# Patient Record
Sex: Male | Born: 1975 | Race: Black or African American | Hispanic: No | Marital: Married | Smoking: Former smoker
Health system: Southern US, Community
[De-identification: ages and names within clinical notes are randomized; demographics above are authoritative.]

---

## 2003-03-26 ENCOUNTER — Encounter: Payer: Self-pay | Admitting: Emergency Medicine

## 2003-03-26 ENCOUNTER — Emergency Department (HOSPITAL_COMMUNITY): Admission: EM | Admit: 2003-03-26 | Discharge: 2003-03-26 | Payer: Self-pay

## 2013-05-06 ENCOUNTER — Ambulatory Visit (INDEPENDENT_AMBULATORY_CARE_PROVIDER_SITE_OTHER): Payer: BC Managed Care – PPO | Admitting: Internal Medicine

## 2013-05-06 VITALS — BP 106/70 | HR 67 | Temp 98.4°F | Resp 18 | Wt 177.0 lb

## 2013-05-06 DIAGNOSIS — J22 Unspecified acute lower respiratory infection: Secondary | ICD-10-CM

## 2013-05-06 DIAGNOSIS — J45909 Unspecified asthma, uncomplicated: Secondary | ICD-10-CM

## 2013-05-06 DIAGNOSIS — R05 Cough: Secondary | ICD-10-CM

## 2013-05-06 DIAGNOSIS — J9801 Acute bronchospasm: Secondary | ICD-10-CM

## 2013-05-06 DIAGNOSIS — F17200 Nicotine dependence, unspecified, uncomplicated: Secondary | ICD-10-CM

## 2013-05-06 DIAGNOSIS — J988 Other specified respiratory disorders: Secondary | ICD-10-CM

## 2013-05-06 MED ORDER — ALBUTEROL SULFATE (2.5 MG/3ML) 0.083% IN NEBU: 2.5000 mg | INHALATION_SOLUTION | Freq: Four times a day (QID) | RESPIRATORY_TRACT | Status: DC | PRN

## 2013-05-06 MED ORDER — HYDROCODONE-HOMATROPINE 5-1.5 MG/5ML PO SYRP: 5.0000 mL | ORAL_SOLUTION | Freq: Four times a day (QID) | ORAL | Status: DC | PRN

## 2013-05-06 MED ORDER — AZITHROMYCIN 250 MG PO TABS: ORAL_TABLET | ORAL | Status: DC

## 2013-05-06 MED ORDER — ALBUTEROL SULFATE (2.5 MG/3ML) 0.083% IN NEBU
2.5000 mg | INHALATION_SOLUTION | Freq: Once | RESPIRATORY_TRACT | Status: AC
  Administered 2013-05-06: 2.5 mg via RESPIRATORY_TRACT

## 2013-05-06 MED ORDER — PREDNISONE 20 MG PO TABS: 20.0000 mg | ORAL_TABLET | Freq: Every day | ORAL | Status: DC

## 2013-05-06 NOTE — Progress Notes (Signed)
  Subjective:    Patient ID: Jeremiah Moore, male    DOB: 02-08-1976, 37 y.o.   MRN: [id number]  HPI Has 3 day history of nasal congestion, sore throat, wheezing. Coughing up yellow sputum. Chest pain only with coughing. Feels like breathing shallow to prevent coughing. Used child's albuterol treatment last night Helped him breath deeper without coughing.Tried some daytime cold medicine with some clearing of congestion.  Has 3 daughters. Everyone in house has been sick off and on.  Was diagnosed with asthmatic bronchitis as a child. Has had some episodes of bronchitis in past, none for a few years.    Smokes 1/3 ppd. Contemplating quitting smoking.   Review of Systems No fever, no chills. No nausea, no vomiting.  No headache,  no ear pain.  Appetite normal.  Drinks a lot of water  4-6 glasses a day.     Objective:   Physical Exam  Constitutional: He is oriented to person, place, and time. He appears well-developed and well-nourished. No distress.  HENT:  Right Ear: Tympanic membrane, external ear and ear canal normal.  Left Ear: Tympanic membrane, external ear and ear canal normal.  Nose: Mucosal edema and rhinorrhea present.  Mouth/Throat: Uvula is midline and mucous membranes are normal. Posterior oropharyngeal edema present. No oropharyngeal exudate or posterior oropharyngeal erythema.  Eyes: Pupils are equal, round, and reactive to light. Right eye exhibits no discharge. Left eye exhibits no discharge.  Neck: Normal range of motion.  Cardiovascular: Normal rate, regular rhythm and normal heart sounds.   Pulmonary/Chest: Effort normal. No respiratory distress.  Wheezes throughout ant/post  Musculoskeletal: He exhibits no edema.  Lymphadenopathy:    He has no cervical adenopathy.  Neurological: He is alert and oriented to person, place, and time.  Skin: Skin is warm and dry. He is not diaphoretic.  Psychiatric: He has a normal mood and affect. His behavior is normal.  Judgment and thought content normal.   Neb-(PROVENTIL) (2.5 MG/3ML) 0.083% nebulizer solution 2.5 mg==subjective and objectiv imprv    Assessment & Plan:  Cough - Plan: hycodan  Tobacco use disorder--disc quit plan  Acute bronchospasm--underlying RAD made worse w/ cigs  Lower respiratory infection (e.g., bronchitis, pneumonia, pneumonitis, pulmonitis)  Meds ordered this encounter  Medications  . albuterol (PROVENTIL) (2.5 MG/3ML) 0.083% nebulizer solution 2.5 mg    Sig:   . HYDROcodone-homatropine (HYCODAN) 5-1.5 MG/5ML syrup    Sig: Take 5 mLs by mouth every 6 (six) hours as needed for cough.    Dispense:  120 mL    Refill:  0  . azithromycin (ZITHROMAX) 250 MG tablet    Sig: As packaged    Dispense:  6 tablet    Refill:  0  . predniSONE (DELTASONE) 20 MG tablet    Sig: Take 1 tablet (20 mg total) by mouth daily. 4/3/3/2/2/1/1 single daily dose for 7 days    Dispense:  16 tablet    Refill:  0   Discussed smoking cessation and provided written information. Patient to return if worsening symptoms, fever/chills, chest pain or SOB.   DG/RPD

## 2013-05-06 NOTE — Patient Instructions (Signed)
Smoking Cessation Quitting smoking is important to your health and has many advantages. However, it is not always easy to quit since nicotine is a very addictive drug. Often times, people try 3 times or more before being able to quit. This document explains the best ways for you to prepare to quit smoking. Quitting takes hard work and a lot of effort, but you can do it. ADVANTAGES OF QUITTING SMOKING  You will live longer, feel better, and live better.  Your body will feel the impact of quitting smoking almost immediately.  Within 20 minutes, blood pressure decreases. Your pulse returns to its normal level.  After 8 hours, carbon monoxide levels in the blood return to normal. Your oxygen level increases.  After 24 hours, the chance of having a heart attack starts to decrease. Your breath, hair, and body stop smelling like smoke.  After 48 hours, damaged nerve endings begin to recover. Your sense of taste and smell improve.  After 72 hours, the body is virtually free of nicotine. Your bronchial tubes relax and breathing becomes easier.  After 2 to 12 weeks, lungs can hold more air. Exercise becomes easier and circulation improves.  The risk of having a heart attack, stroke, cancer, or lung disease is greatly reduced.  After 1 year, the risk of coronary heart disease is cut in half.  After 5 years, the risk of stroke falls to the same as a nonsmoker.  After 10 years, the risk of lung cancer is cut in half and the risk of other cancers decreases significantly.  After 15 years, the risk of coronary heart disease drops, usually to the level of a nonsmoker.  If you are pregnant, quitting smoking will improve your chances of having a healthy baby.  The people you live with, especially any children, will be healthier.  You will have extra money to spend on things other than cigarettes. QUESTIONS TO THINK ABOUT BEFORE ATTEMPTING TO QUIT You may want to talk about your answers with your  caregiver.  Why do you want to quit?  If you tried to quit in the past, what helped and what did not?  What will be the most difficult situations for you after you quit? How will you plan to handle them?  Who can help you through the tough times? Your family? Friends? A caregiver?  What pleasures do you get from smoking? What ways can you still get pleasure if you quit? Here are some questions to ask your caregiver:  How can you help me to be successful at quitting?  What medicine do you think would be best for me and how should I take it?  What should I do if I need more help?  What is smoking withdrawal like? How can I get information on withdrawal? GET READY  Set a quit date.  Change your environment by getting rid of all cigarettes, ashtrays, matches, and lighters in your home, car, or work. Do not let people smoke in your home.  Review your past attempts to quit. Think about what worked and what did not. GET SUPPORT AND ENCOURAGEMENT You have a better chance of being successful if you have help. You can get support in many ways.  Tell your family, friends, and co-workers that you are going to quit and need their support. Ask them not to smoke around you.  Get individual, group, or telephone counseling and support. Programs are available at local hospitals and health centers. Call your local health department for   information about programs in your area.  Spiritual beliefs and practices may help some smokers quit.  Download a "quit meter" on your computer to keep track of quit statistics, such as how long you have gone without smoking, cigarettes not smoked, and money saved.  Get a self-help book about quitting smoking and staying off of tobacco. LEARN NEW SKILLS AND BEHAVIORS  Distract yourself from urges to smoke. Talk to someone, go for a walk, or occupy your time with a task.  Change your normal routine. Take a different route to work. Drink tea instead of coffee.  Eat breakfast in a different place.  Reduce your stress. Take a hot bath, exercise, or read a book.  Plan something enjoyable to do every day. Reward yourself for not smoking.  Explore interactive web-based programs that specialize in helping you quit. GET MEDICINE AND USE IT CORRECTLY Medicines can help you stop smoking and decrease the urge to smoke. Combining medicine with the above behavioral methods and support can greatly increase your chances of successfully quitting smoking.  Nicotine replacement therapy helps deliver nicotine to your body without the negative effects and risks of smoking. Nicotine replacement therapy includes nicotine gum, lozenges, inhalers, nasal sprays, and skin patches. Some may be available over-the-counter and others require a prescription.  Antidepressant medicine helps people abstain from smoking, but how this works is unknown. This medicine is available by prescription.  Nicotinic receptor partial agonist medicine simulates the effect of nicotine in your brain. This medicine is available by prescription. Ask your caregiver for advice about which medicines to use and how to use them based on your health history. Your caregiver will tell you what side effects to look out for if you choose to be on a medicine or therapy. Carefully read the information on the package. Do not use any other product containing nicotine while using a nicotine replacement product.  RELAPSE OR DIFFICULT SITUATIONS Most relapses occur within the first 3 months after quitting. Do not be discouraged if you start smoking again. Remember, most people try several times before finally quitting. You may have symptoms of withdrawal because your body is used to nicotine. You may crave cigarettes, be irritable, feel very hungry, cough often, get headaches, or have difficulty concentrating. The withdrawal symptoms are only temporary. They are strongest when you first quit, but they will go away within  10 14 days. To reduce the chances of relapse, try to:  Avoid drinking alcohol. Drinking lowers your chances of successfully quitting.  Reduce the amount of caffeine you consume. Once you quit smoking, the amount of caffeine in your body increases and can give you symptoms, such as a rapid heartbeat, sweating, and anxiety.  Avoid smokers because they can make you want to smoke.  Do not let weight gain distract you. Many smokers will gain weight when they quit, usually less than 10 pounds. Eat a healthy diet and stay active. You can always lose the weight gained after you quit.  Find ways to improve your mood other than smoking. FOR MORE INFORMATION  www.smokefree.gov  Document Released: 07/14/2001 Document Revised: 01/19/2012 Document Reviewed: 10/29/2011 [employer] Patient Information 2014 [employer].  

## 2013-05-07 DIAGNOSIS — J45909 Unspecified asthma, uncomplicated: Secondary | ICD-10-CM | POA: Insufficient documentation

## 2013-05-07 DIAGNOSIS — F17200 Nicotine dependence, unspecified, uncomplicated: Secondary | ICD-10-CM | POA: Insufficient documentation

## 2021-11-25 ENCOUNTER — Other Ambulatory Visit: Payer: Self-pay | Admitting: Family Medicine

## 2021-11-25 DIAGNOSIS — M879 Osteonecrosis, unspecified: Secondary | ICD-10-CM

## 2021-11-25 DIAGNOSIS — M25552 Pain in left hip: Secondary | ICD-10-CM

## 2021-11-26 ENCOUNTER — Ambulatory Visit
Admission: RE | Admit: 2021-11-26 | Discharge: 2021-11-26 | Disposition: A | Discharge status: Home / Self Care | Payer: 59 | Source: Ambulatory Visit | Attending: Family Medicine | Admitting: Family Medicine

## 2021-11-26 DIAGNOSIS — M879 Osteonecrosis, unspecified: Secondary | ICD-10-CM

## 2021-11-26 DIAGNOSIS — M25552 Pain in left hip: Secondary | ICD-10-CM

## 2021-11-26 MED ORDER — GADOBENATE DIMEGLUMINE 529 MG/ML IV SOLN
18.0000 mL | Freq: Once | INTRAVENOUS | Status: AC | PRN
  Administered 2021-11-26: 18 mL via INTRAVENOUS

## 2022-01-08 ENCOUNTER — Encounter: Payer: Self-pay | Admitting: Cardiology

## 2022-01-08 ENCOUNTER — Ambulatory Visit: Payer: 59 | Admitting: Cardiology

## 2022-01-08 VITALS — BP 170/115 | HR 84 | Temp 97.2°F | Resp 17 | Ht 72.0 in | Wt 197.0 lb

## 2022-01-08 DIAGNOSIS — I10 Essential (primary) hypertension: Secondary | ICD-10-CM | POA: Insufficient documentation

## 2022-01-08 DIAGNOSIS — R011 Cardiac murmur, unspecified: Secondary | ICD-10-CM

## 2022-01-08 DIAGNOSIS — E782 Mixed hyperlipidemia: Secondary | ICD-10-CM | POA: Insufficient documentation

## 2022-01-08 MED ORDER — AMLODIPINE BESYLATE 5 MG PO TABS: 5.0000 mg | ORAL_TABLET | Freq: Every day | ORAL | 3 refills | Status: DC

## 2022-01-08 NOTE — Progress Notes (Signed)
Patient referred by Vernie Shanks, MD for hypertension, murmur  Subjective:   Jeremiah Moore, male    DOB: 02/26/1976, 46 y.o.   MRN: [id number]   Chief Complaint  Patient presents with   New Patient (Initial Visit)   Heart Murmur     HPI  46 y.o. African American male with hypertension, mixed hyperlipidemia, murmur, pre-op evaluation prior to hip arthroplasty for avascular necrosis.  Patient works at a [employer], job is not particularly physical. He does not do any regular physical activity, especially since he started having hip pain. However, he denies chest pain, shortness of breath, palpitations, leg edema, orthopnea, PND, TIA/syncope. He has had hypertension for a while, but not been on medications consistently. He drinks 1-3 drinks of Vodka couple times a week. He admits to eating outside and canned food at least couple times a week. He was prescribed Triamterene HCTZ by his PCP, which he has not started yet.     History reviewed. No pertinent past medical history.   History reviewed. No pertinent surgical history.   Social History   Tobacco Use  Smoking Status Former   Packs/day: 0.50   Years: 20.00   Total pack years: 10.00   Types: Cigarettes   Quit date: 08/2021   Years since quitting: 0.4  Smokeless Tobacco Never    Social History   Substance and Sexual Activity  Alcohol Use Yes   Comment: OCC     Family History  Problem Relation Age of Onset   Hypertension Father    Clotting disorder Brother       Current Outpatient Medications:    Cholecalciferol (VITAMIN D3) 50 MCG (2000 UT) capsule, Take 1 capsule by mouth daily., Disp: , Rfl:    triamterene-hydrochlorothiazide (MAXZIDE-25) 37.5-25 MG tablet, Take 1 tablet by mouth daily., Disp: , Rfl:    azithromycin (ZITHROMAX) 250 MG tablet, As packaged, Disp: 6 tablet, Rfl: 0   HYDROcodone-homatropine (HYCODAN) 5-1.5 MG/5ML syrup, Take 5 mLs by mouth every 6 (six) hours as needed for  cough., Disp: 120 mL, Rfl: 0   predniSONE (DELTASONE) 20 MG tablet, Take 1 tablet (20 mg total) by mouth daily. 4/3/3/2/2/1/1 single daily dose for 7 days, Disp: 16 tablet, Rfl: 0   Cardiovascular and other pertinent studies:  Reviewed external labs and tests, independently interpreted  EKG 01/08/2022: Sinus rhythm 76 bpm Normal EKG  Recent labs: 01/01/2022: Glucose 84, BUN/Cr 13/1.17. EGFR 78. Na/K 140/4.6. Rest of the CMP normal H/H 13/40. MCV 93. Platelets 315 HbA1C 5.6% Chol 244, TG 96, HDL 68, LDL 160    Review of Systems  Cardiovascular:  Negative for chest pain, dyspnea on exertion, leg swelling, palpitations and syncope.  Musculoskeletal:  Positive for joint pain.         Vitals:   01/08/22 1305 01/08/22 1306  BP: (!) 163/110 (!) 170/115  Pulse: 78 84  Resp: 17   Temp: (!) 97.2 F (36.2 C)   SpO2: 99% 99%     Body mass index is 26.72 kg/m. Filed Weights   01/08/22 1305  Weight: 197 lb (89.4 kg)     Objective:   Physical Exam Vitals and nursing note reviewed.  Constitutional:      General: He is not in acute distress. Neck:     Vascular: No JVD.  Cardiovascular:     Rate and Rhythm: Normal rate and regular rhythm.     Heart sounds: Murmur heard.     Harsh midsystolic murmur is  present with a grade of 1/6 at the upper right sternal border radiating to the neck.  Pulmonary:     Effort: Pulmonary effort is normal.     Breath sounds: Normal breath sounds. No wheezing or rales.  Musculoskeletal:     Right lower leg: No edema.     Left lower leg: No edema.           Visit diagnoses:   ICD-10-CM   1. Heart murmur  R01.1 EKG 12-Lead    2. Primary hypertension  I10 PCV ECHOCARDIOGRAM COMPLETE    3. Mixed hyperlipidemia  E78.2        Orders Placed This Encounter  Procedures   EKG 12-Lead   PCV ECHOCARDIOGRAM COMPLETE     Meds ordered this encounter  Medications   amLODipine (NORVASC) 5 MG tablet    Sig: Take 1 tablet (5 mg total) by  mouth daily.    Dispense:  90 tablet    Refill:  3     Assessment & Recommendations:   46 y.o. African American male with hypertension, mixed hyperlipidemia, murmur, pre-op evaluation prior to hip arthroplasty for avascular necrosis.  Hypertension: Possibly familial, uncontrolled. Discussed diet and lifestyle modifications, especially reducing alcohol and salt intake. In addition to Triamterene HCTZ ordered by PCP, will add amlodipine 5 mg daily.  Will obtain echocardiogram  Mixed hyperlipidemia: Recommend calcium score for risk stratification, but this can be done after hop surgery.   Pre-op evaluation: Low cardiac risk for hip surgery, provided blood pressure is better controlled. Reassess next week.  Further recommendations after above testing  Thank you for referring the patient to [place]. Please feel free to contact with any questions.   [name], MD Pager: [phone] Office: [phone]

## 2022-01-12 ENCOUNTER — Ambulatory Visit
Admission: RE | Admit: 2022-01-12 | Discharge: 2022-01-12 | Disposition: A | Discharge status: Home / Self Care | Payer: 59 | Source: Ambulatory Visit | Attending: Cardiology | Admitting: Cardiology

## 2022-01-12 DIAGNOSIS — E782 Mixed hyperlipidemia: Secondary | ICD-10-CM

## 2022-01-16 ENCOUNTER — Encounter: Payer: Self-pay | Admitting: Cardiology

## 2022-01-16 ENCOUNTER — Ambulatory Visit: Payer: 59 | Admitting: Cardiology

## 2022-01-16 ENCOUNTER — Ambulatory Visit: Payer: 59

## 2022-01-16 VITALS — BP 136/93 | HR 108 | Temp 98.1°F | Resp 17 | Ht 72.0 in | Wt 187.4 lb

## 2022-01-16 DIAGNOSIS — E782 Mixed hyperlipidemia: Secondary | ICD-10-CM

## 2022-01-16 DIAGNOSIS — I10 Essential (primary) hypertension: Secondary | ICD-10-CM

## 2022-01-16 MED ORDER — AMLODIPINE BESYLATE 10 MG PO TABS: 10.0000 mg | ORAL_TABLET | Freq: Every day | ORAL | 3 refills | Status: AC

## 2022-01-16 NOTE — Progress Notes (Addendum)
Patient referred by No ref. provider found for hypertension, murmur  Subjective:   Jeremiah Moore, male    DOB: 12-Feb-1976, 46 y.o.   MRN: [id number]   Chief Complaint  Patient presents with   Follow-up    1 [place]     HPI  46 y.o. African American male with hypertension, mixed hyperlipidemia, murmur, pre-op evaluation prior to hip arthroplasty for avascular necrosis.  No complaints today, blood pressure is improving.  Initial consultation visit: Patient works at a [employer], job is not particularly physical. He does not do any regular physical activity, especially since he started having hip pain. However, he denies chest pain, shortness of breath, palpitations, leg edema, orthopnea, PND, TIA/syncope. He has had hypertension for a while, but not been on medications consistently. He drinks 1-3 drinks of Vodka couple times a week. He admits to eating outside and canned food at least couple times a week. He was prescribed Triamterene HCTZ by his PCP, which he has not started yet.     History reviewed. No pertinent past medical history.   History reviewed. No pertinent surgical history.   Social History   Tobacco Use  Smoking Status Former   Packs/day: 0.50   Years: 20.00   Total pack years: 10.00   Types: Cigarettes   Quit date: 08/2021   Years since quitting: 0.4  Smokeless Tobacco Never    Social History   Substance and Sexual Activity  Alcohol Use Yes   Comment: OCC     Family History  Problem Relation Age of Onset   Hypertension Father    Clotting disorder Brother       Current Outpatient Medications:    amLODipine (NORVASC) 5 MG tablet, Take 1 tablet (5 mg total) by mouth daily., Disp: 90 tablet, Rfl: 3   Cholecalciferol (VITAMIN D3) 50 MCG (2000 UT) capsule, Take 1 capsule by mouth daily., Disp: , Rfl:    triamterene-hydrochlorothiazide (MAXZIDE-25) 37.5-25 MG tablet, Take 1 tablet by mouth daily., Disp: , Rfl:     Cardiovascular and other pertinent studies:  Reviewed external labs and tests, independently interpreted  Echocardiogram 01/16/2022:  Left ventricle cavity is normal in size and wall thickness. Normal global  wall motion. Normal LV systolic function with EF 71%. Normal diastolic  filling pattern.  No significant valvular abnormality.  Normal right atrial pressure.   CT cardiac scoring 01/12/2022: Calcium score 0  EKG 01/08/2022: Sinus rhythm 76 bpm Normal EKG  Recent labs: 01/01/2022: Glucose 84, BUN/Cr 13/1.17. EGFR 78. Na/K 140/4.6. Rest of the CMP normal H/H 13/40. MCV 93. Platelets 315 HbA1C 5.6% Chol 244, TG 96, HDL 68, LDL 160    Review of Systems  Cardiovascular:  Negative for chest pain, dyspnea on exertion, leg swelling, palpitations and syncope.  Musculoskeletal:  Positive for joint pain.         Vitals:   01/16/22 1302 01/16/22 1304  BP: (!) 161/109 (!) 136/93  Pulse: (!) 101 (!) 108  Resp: 17   Temp: 98.1 F (36.7 C)   SpO2: 98% 98%     Body mass index is 25.42 kg/m. Filed Weights   01/16/22 1302  Weight: 187 lb 6.4 oz (85 kg)     Objective:   Physical Exam Vitals and nursing note reviewed.  Constitutional:      General: He is not in acute distress. Neck:     Vascular: No JVD.  Cardiovascular:     Rate and Rhythm: Normal  rate and regular rhythm.     Heart sounds: No murmur heard. Pulmonary:     Effort: Pulmonary effort is normal.     Breath sounds: Normal breath sounds. No wheezing or rales.  Musculoskeletal:     Right lower leg: No edema.     Left lower leg: No edema.           Visit diagnoses:   ICD-10-CM   1. Primary hypertension  I10     2. Mixed hyperlipidemia  E78.2        Meds ordered this encounter  Medications   amLODipine (NORVASC) 10 MG tablet    Sig: Take 1 tablet (10 mg total) by mouth daily.    Dispense:  90 tablet    Refill:  3     Assessment & Recommendations:   46 y.o. African American male  with hypertension, mixed hyperlipidemia, murmur, pre-op evaluation prior to hip arthroplasty for avascular necrosis.  Hypertension: Possibly familial, uncontrolled. Discussed diet and lifestyle modifications, especially reducing alcohol and salt intake. Continue Triamterene HCTZ (okay to hold for surgery) Increase amlodipine  to 10 mg daily.  Will obtain echocardiogram  Mixed hyperlipidemia: Calcium score 0. Okay to try diet and lifestyle modifications first.   Pre-op evaluation: Low cardiac risk for hip surgery  F/u in 6 months    [name], MD Pager: [phone] Office: [phone]

## 2022-03-27 ENCOUNTER — Ambulatory Visit: Payer: 59 | Admitting: Cardiology

## 2022-04-10 ENCOUNTER — Ambulatory Visit: Payer: 59 | Admitting: Cardiology

## 2022-04-10 ENCOUNTER — Encounter: Payer: Self-pay | Admitting: Cardiology

## 2022-04-10 VITALS — BP 140/92 | HR 88 | Temp 98.0°F | Resp 16 | Ht 72.0 in | Wt 191.0 lb

## 2022-04-10 DIAGNOSIS — I10 Essential (primary) hypertension: Secondary | ICD-10-CM

## 2022-04-10 NOTE — Progress Notes (Signed)
Patient referred by No ref. provider found for hypertension, murmur  Subjective:   Jeremiah Moore, male    DOB: 28-May-1976, 46 y.o.   MRN: [id number]   Chief Complaint  Patient presents with   Hypertension   Follow-up    3 month      HPI  46 y.o. African American male with hypertension, mixed hyperlipidemia, murmur, pre-op evaluation prior to hip arthroplasty for avascular necrosis.  Patient underwent hip replacement surgery with no perioperative cardiac events.  His blood pressure is elevated today.  He does not check regularly at home.  Reportedly, SBP was in 130s when checked few days ago.  He is compliant with his medical therapy.  We have previously discussed diet and lifestyle modification.  Initial consultation visit: Patient works at a [employer], job is not particularly physical. He does not do any regular physical activity, especially since he started having hip pain. However, he denies chest pain, shortness of breath, palpitations, leg edema, orthopnea, PND, TIA/syncope. He has had hypertension for a while, but not been on medications consistently. He drinks 1-3 drinks of Vodka couple times a week. He admits to eating outside and canned food at least couple times a week. He was prescribed Triamterene HCTZ by his PCP, which he has not started yet.     History reviewed. No pertinent past medical history.   History reviewed. No pertinent surgical history.   Social History   Tobacco Use  Smoking Status Former   Packs/day: 0.50   Years: 20.00   Total pack years: 10.00   Types: Cigarettes   Quit date: 08/2021   Years since quitting: 0.6  Smokeless Tobacco Never    Social History   Substance and Sexual Activity  Alcohol Use Yes   Comment: OCC     Family History  Problem Relation Age of Onset   Hypertension Father    Clotting disorder Brother       Current Outpatient Medications:    amLODipine (NORVASC) 10 MG tablet, Take 1 tablet (10 mg  total) by mouth daily., Disp: 90 tablet, Rfl: 3   Cholecalciferol (VITAMIN D3) 1.25 MG (50000 UT) CAPS, Take 1 capsule by mouth once a week., Disp: , Rfl:    triamterene-hydrochlorothiazide (MAXZIDE-25) 37.5-25 MG tablet, Take 1 tablet by mouth daily., Disp: , Rfl:    Cardiovascular and other pertinent studies:  Reviewed external labs and tests, independently interpreted  Echocardiogram 01/16/2022:  Left ventricle cavity is normal in size and wall thickness. Normal global  wall motion. Normal LV systolic function with EF 71%. Normal diastolic  filling pattern.  No significant valvular abnormality.  Normal right atrial pressure.   CT cardiac scoring 01/12/2022: Calcium score 0  EKG 01/08/2022: Sinus rhythm 76 bpm Normal EKG  Recent labs: 01/01/2022: Glucose 84, BUN/Cr 13/1.17. EGFR 78. Na/K 140/4.6. Rest of the CMP normal H/H 13/40. MCV 93. Platelets 315 HbA1C 5.6% Chol 244, TG 96, HDL 68, LDL 160    Review of Systems  Cardiovascular:  Negative for chest pain, dyspnea on exertion, leg swelling, palpitations and syncope.  Musculoskeletal:  Positive for joint pain.         Vitals:   04/10/22 0828 04/10/22 0829  BP: (!) 154/88 (!) 140/92  Pulse: 83 88  Resp: 16   Temp: 98 F (36.7 C)   SpO2: 98%       Body mass index is 25.9 kg/m. Filed Weights   04/10/22 0828  Weight: 191 lb (86.6 kg)  Objective:   Physical Exam Vitals and nursing note reviewed.  Constitutional:      General: He is not in acute distress. Neck:     Vascular: No JVD.  Cardiovascular:     Rate and Rhythm: Normal rate and regular rhythm.     Heart sounds: No murmur heard. Pulmonary:     Effort: Pulmonary effort is normal.     Breath sounds: Normal breath sounds. No wheezing or rales.  Musculoskeletal:     Right lower leg: No edema.     Left lower leg: No edema.           Visit diagnoses:   ICD-10-CM   1. Primary hypertension  I10 Aldosterone + renin activity w/ ratio     PCV RENAL/RENAL ARTERY DUPLEX COMPLETE    TSH      Orders Placed This Encounter  Procedures   Aldosterone + renin activity w/ ratio   TSH   PCV RENAL/RENAL ARTERY DUPLEX COMPLETE     Assessment & Recommendations:   46 y.o. African American male with hypertension, mixed hyperlipidemia, murmur, pre-op evaluation prior to hip arthroplasty for avascular necrosis.  Hypertension: Possibly familial, uncontrolled. Will check renal artery duplex, renal/aldosterone, TSH to r/o secondary hypertension Recommend regular home monitoring. Continue Triamterene HCTZ and amlodipine at current doses.  We will revisit in 4 to 6 weeks after above tests and reviewing home blood pressure log.  Mixed hyperlipidemia: Calcium score 0. Okay to try diet and lifestyle modifications first.   F/u in 4-6 weeks     Jeremiah Mormon, MD Pager: [phone] Office: [phone]

## 2022-05-08 ENCOUNTER — Other Ambulatory Visit: Payer: 59

## 2022-05-10 IMAGING — MR MR HIP*L* WO/W CM
8 series · 38 of 40 positions shown · IV contrast (18ml multihance)
Comparison: None.

CLINICAL DATA: Left hip pain.  Acute, decreased range of motion

EXAM:
MRI OF THE LEFT HIP WITHOUT AND WITH CONTRAST
TECHNIQUE: Multiplanar, multisequence MR imaging was performed both before and
after administration of intravenous contrast.
CONTRAST:  18mL MULTIHANCE GADOBENATE DIMEGLUMINE 529 MG/ML IV SOLN

[Series 3: T1 · coronal · 4.0mm · 1.19mm/px · 5 of 28 slices shown]
[im 1/28]
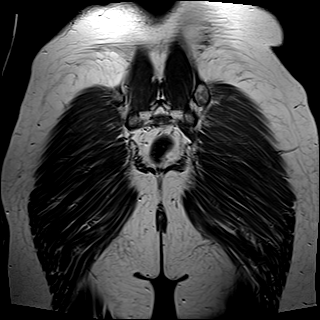
[im 7/28]
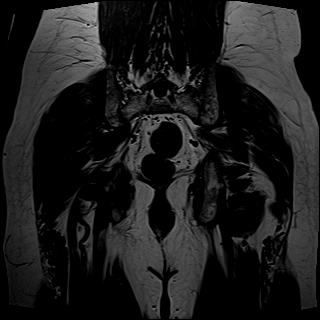
[im 14/28]
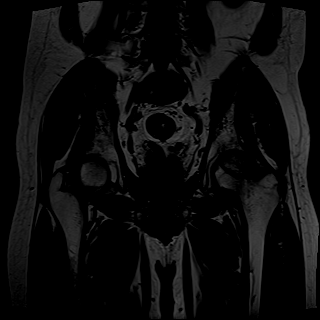
[im 21/28]
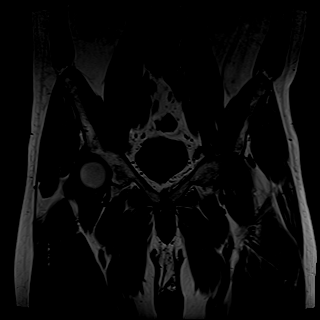
[im 28/28]
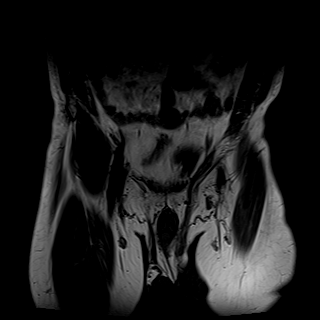

[Series 4: T2 fat-sat · coronal · 4.0mm · 1.19mm/px · 4 of 28 slices shown (1 of 2)]
[im 1/28]
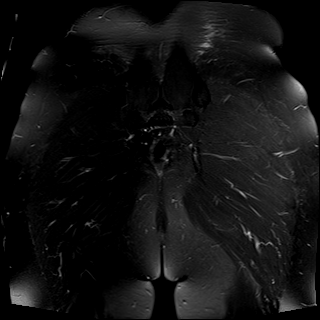
[im 10/28]
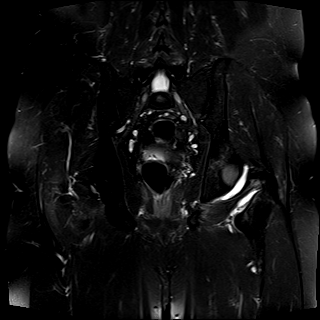
[im 19/28]
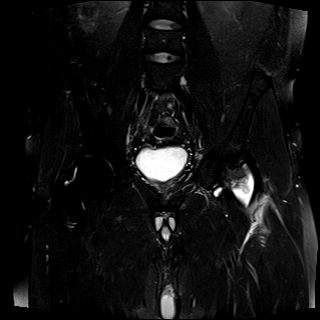
[im 28/28]
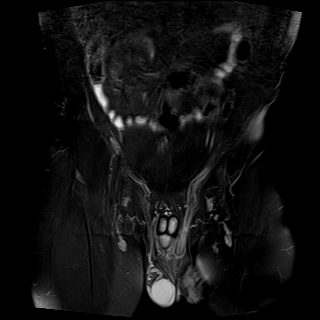

[Series 5: T2 fat-sat · axial · 4.0mm · 0.62mm/px · z∈[-64,+112]mm · 6 of 38 slices shown (2 of 2)]
[im 1/38]
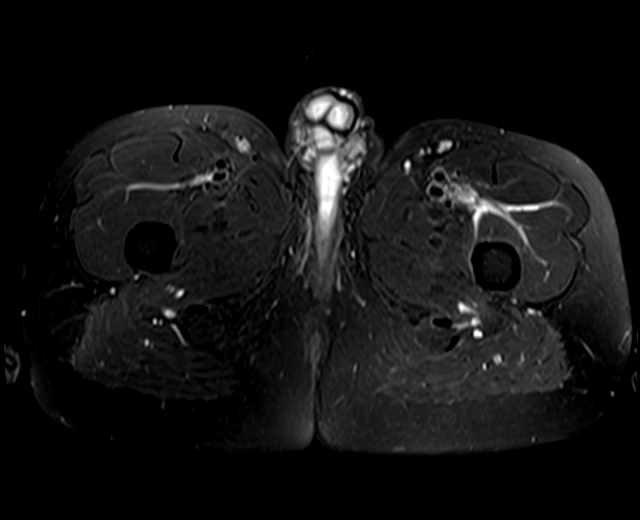
[im 8/38]
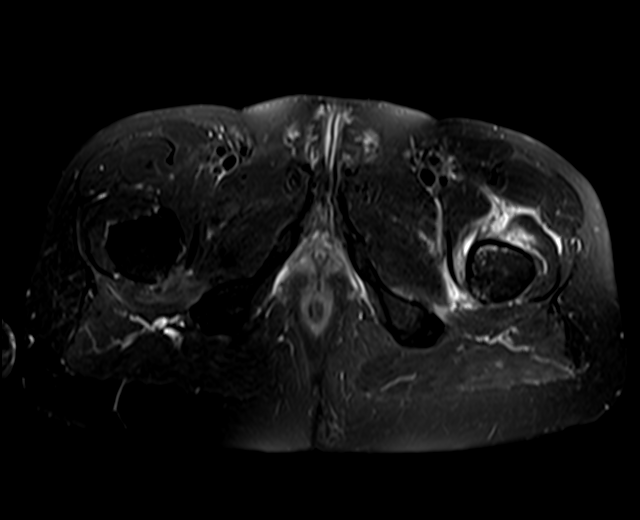
[im 15/38]
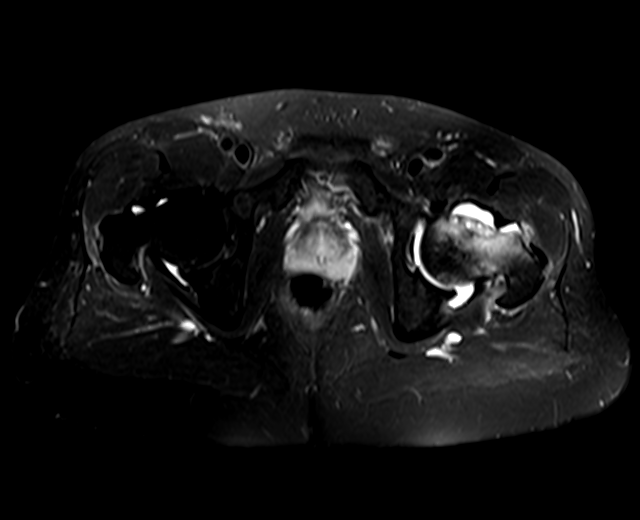
[im 23/38]
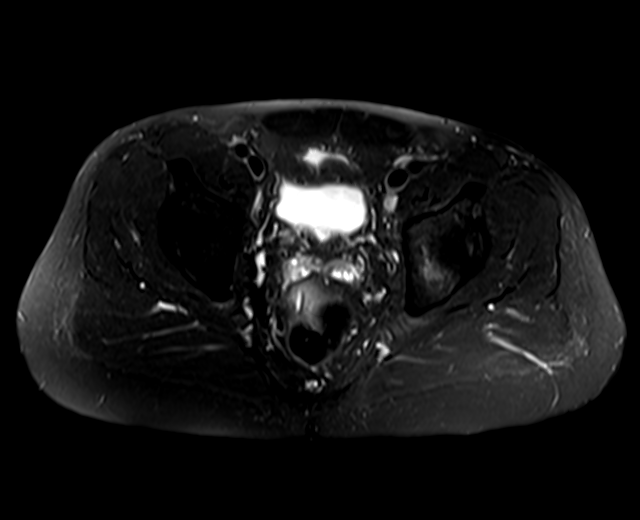
[im 30/38]
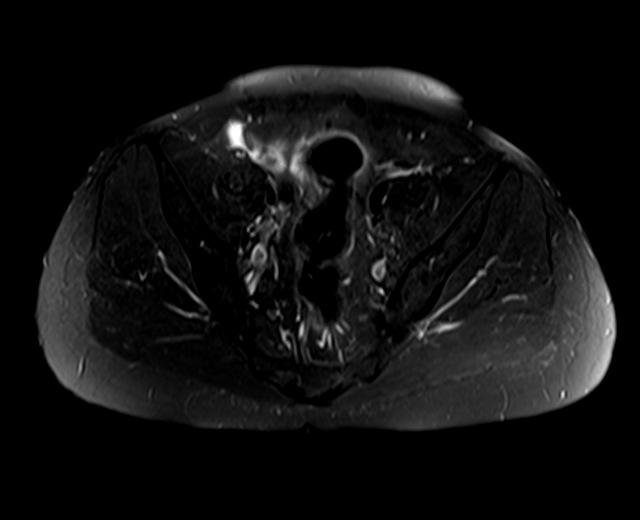
[im 38/38]
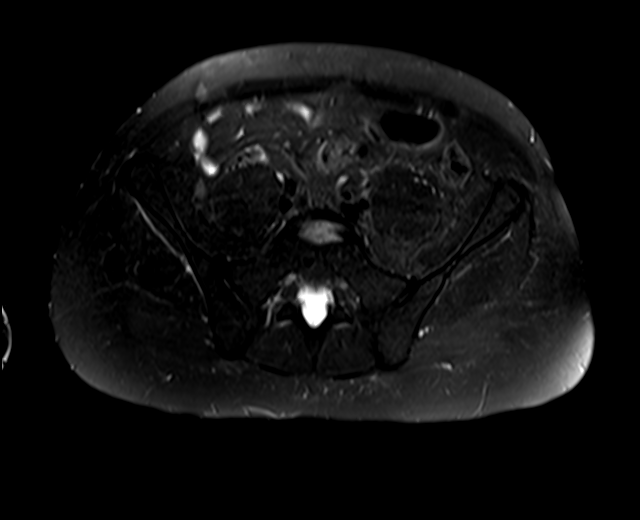

[Series 6: PD fat-sat · sagittal · 4.0mm · 0.78mm/px · 5 of 30 slices shown (1 of 2)]
[im 1/30]
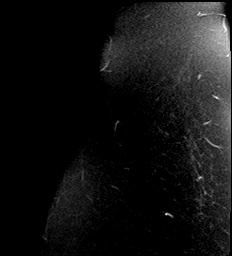
[im 8/30]
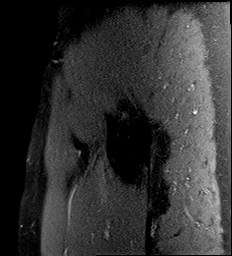
[im 15/30]
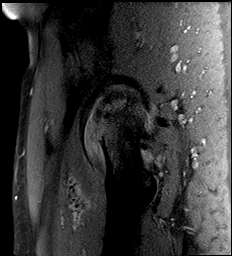
[im 22/30]
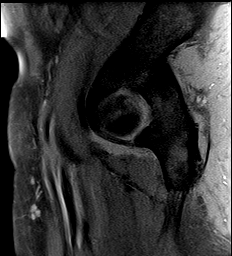
[im 30/30]
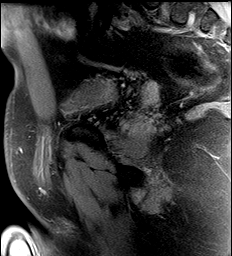

[Series 7: PD fat-sat · coronal · 4.0mm · 0.74mm/px · 4 of 27 slices shown (2 of 2)]
[im 1/27]
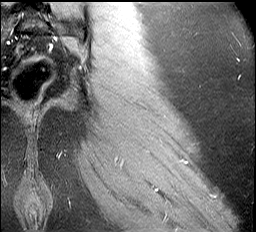
[im 9/27]
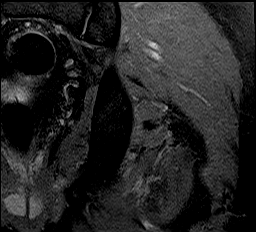
[im 18/27]
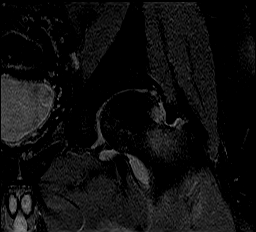
[im 27/27]
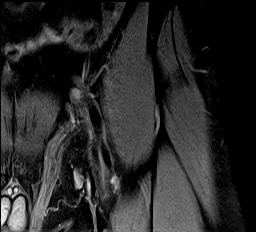

[Series 8: T1 fat-sat · axial · non-contrast · 4.0mm · 0.86mm/px · z∈[-72,+104]mm · 6 of 38 slices shown]
[im 1/38]
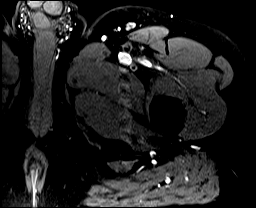
[im 8/38]
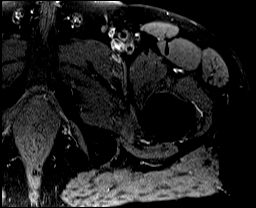
[im 15/38]
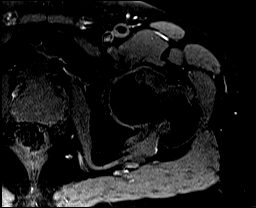
[im 23/38]
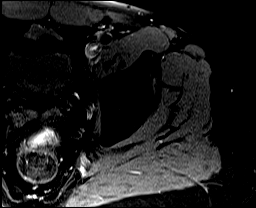
[im 30/38]
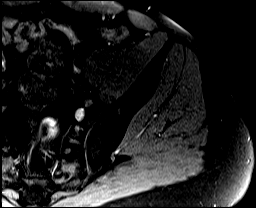
[im 38/38]
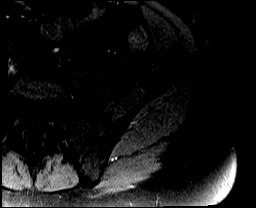

[Series 9: T1 fat-sat post-contrast · axial · 4.0mm · 0.86mm/px · z∈[-72,+104]mm · 6 of 38 slices shown (1 of 2)]
[im 1/38]
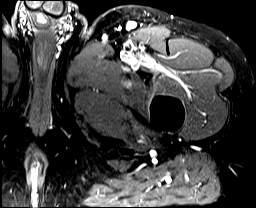
[im 8/38]
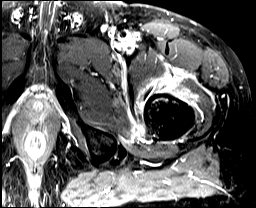
[im 15/38]
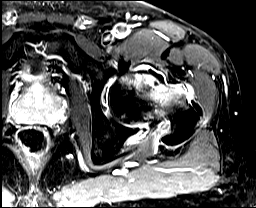
[im 23/38]
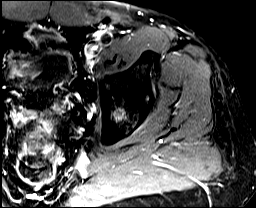
[im 30/38]
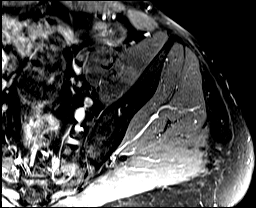
[im 38/38]
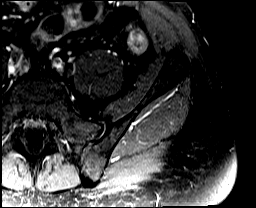

[Series 10: T1 fat-sat post-contrast · coronal · 4.0mm · 0.37mm/px · 2 of 27 slices shown (2 of 2)]
[im 1/27]
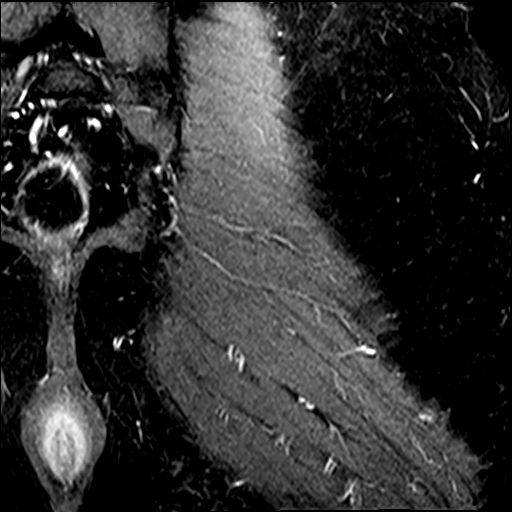
[im 9/27]
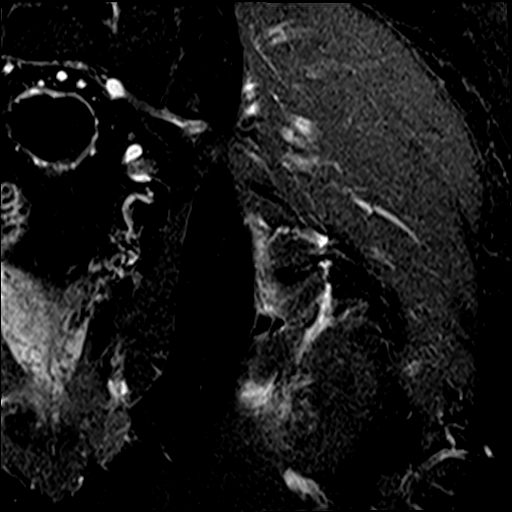

[38 of 40 positions shown; findings below may reference images not displayed]

FINDINGS: Bone

There is curvilinear T1 hypointense line through the femoral head.
There is subchondral collapse about the superolateral aspect of the
femoral head. There is edema involving the femoral head and neck.
Findings most consistent with avascular necrosis. There is also mild
edema of the posterior aspect of the acetabulum concerning for bone
contusion/nondisplaced fracture, which may be sequela of recent
trauma.

SI joints are normal. No SI joint widening or erosive changes.

Lower lumbar spine demonstrates no focal abnormality.

Alignment

Normal. No subluxation.

Dysplasia

None.

Joint effusion

Moderate left hip joint effusion.

Labrum

Degenerative changes of the labrum without evidence of acute tear.

Cartilage

Articular cartilage thinning.

Capsule and ligaments

Normal.

Muscles and Tendons

Flexor, extensor and adductor tendons are within normal limits. Mild
edema about the attachment of bilateral gluteus medius.

Other Findings

No bursal fluid.

Viscera

No abnormality seen in pelvis. No lymphadenopathy. No free fluid in
the pelvis.
IMPRESSION: 1. Acute avascular necrosis of the left femoral head with
subchondral flattening about the superolateral aspect of the femoral
head with associate marked edema of the femoral head and neck and
reactive joint effusion.

2. Mild marrow edema of the posterosuperior acetabulum, which is
likely sequela of trauma/bone contusion.

3. Mild degenerative changes of the right hip joint otherwise
unremarkable.

## 2022-05-22 ENCOUNTER — Ambulatory Visit: Payer: 59 | Admitting: Cardiology

## 2022-09-09 ENCOUNTER — Ambulatory Visit: Admission: EM | Admit: 2022-09-09 | Discharge: 2022-09-09 | Discharge status: Against Medical Advice | Payer: 59

## 2022-09-09 NOTE — ED Triage Notes (Signed)
Pt called in lobby and on phone with no answer.

## 2023-02-06 ENCOUNTER — Ambulatory Visit
Admission: EM | Admit: 2023-02-06 | Discharge: 2023-02-06 | Disposition: A | Discharge status: Home / Self Care | Payer: Managed Care, Other (non HMO) | Attending: Internal Medicine | Admitting: Internal Medicine

## 2023-02-06 DIAGNOSIS — Z23 Encounter for immunization: Secondary | ICD-10-CM

## 2023-02-06 DIAGNOSIS — S61258A Open bite of other finger without damage to nail, initial encounter: Secondary | ICD-10-CM | POA: Diagnosis not present

## 2023-02-06 DIAGNOSIS — W540XXA Bitten by dog, initial encounter: Secondary | ICD-10-CM | POA: Diagnosis not present

## 2023-02-06 MED ORDER — IBUPROFEN 600 MG PO TABS: 600.0000 mg | ORAL_TABLET | Freq: Four times a day (QID) | ORAL | 0 refills | Status: AC | PRN

## 2023-02-06 MED ORDER — AMOXICILLIN-POT CLAVULANATE 875-125 MG PO TABS: 1.0000 | ORAL_TABLET | Freq: Two times a day (BID) | ORAL | 0 refills | Status: AC

## 2023-02-06 MED ORDER — TETANUS-DIPHTH-ACELL PERTUSSIS 5-2.5-18.5 LF-MCG/0.5 IM SUSY
0.5000 mL | PREFILLED_SYRINGE | Freq: Once | INTRAMUSCULAR | Status: AC
  Administered 2023-02-06: 0.5 mL via INTRAMUSCULAR

## 2023-02-06 NOTE — ED Triage Notes (Signed)
Last Tdap: Unknown.

## 2023-02-06 NOTE — ED Triage Notes (Signed)
Here for Animal bite "friends dog" last night. [facility] Dog is a pet and UTD on immunizations. No fever. Site: Right Thumb "numerous punctures/scrapes".

## 2023-02-06 NOTE — ED Provider Notes (Signed)
[facility]    CSN: [id number] Arrival date & time: 02/06/23  0802      History   Chief Complaint Chief Complaint  Patient presents with   Animal Bite    Right Thumb    HPI Jeremiah Moore is a 47 y.o. male comes to the urgent care to be evaluated for dog bite.  Patient was playing with the neighbors dog yesterday when the dog bit his right thumb.  The bite was provoked.  The dog is vaccinated against rabies and is up-to-date on his immunizations.  No discharge.  Patient has throbbing pain in the right thumb.  Pain is associated with swelling.  Mild erythema of the thumb.  No discharge noted.  Patient does not know his vaccination status regarding tetanus vaccination.  HPI  History reviewed. No pertinent past medical history.  Patient Active Problem List   Diagnosis Date Noted   Heart murmur 01/08/2022   Primary hypertension 01/08/2022   Mixed hyperlipidemia 01/08/2022   RAD (reactive airway disease) 05/07/2013   Smoker 05/07/2013    History reviewed. No pertinent surgical history.     Home Medications    Prior to Admission medications   Medication Sig Start Date End Date Taking? Authorizing Provider  amLODipine (NORVASC) 10 MG tablet Take 1 tablet (10 mg total) by mouth daily. 01/16/22 01/06/24 Yes Patwardhan, Manish J, MD  amoxicillin-clavulanate (AUGMENTIN) 875-125 MG tablet Take 1 tablet by mouth every 12 (twelve) hours. 02/06/23  Yes Henna Derderian, Britta Mccreedy, MD  fluticasone (FLONASE) 50 MCG/ACT nasal spray Place 2 sprays into both nostrils daily. 11/14/22  Yes [provider]  ibuprofen (ADVIL) 600 MG tablet Take 1 tablet (600 mg total) by mouth every 6 (six) hours as needed. 02/06/23  Yes Quinlynn Cuthbert, Britta Mccreedy, MD  triamterene-hydrochlorothiazide (MAXZIDE-25) 37.5-25 MG tablet Take 1 tablet by mouth daily.   Yes [provider]  Cholecalciferol (VITAMIN D3) 1.25 MG (50000 UT) CAPS Take 1 capsule by mouth once a week. 04/03/22   [provider]    Family History Family History  Problem Relation Age of Onset   Hypertension Father    Clotting disorder Brother     Social History Social History   Tobacco Use   Smoking status: Former    Packs/day: 0.50    Years: 20.00    Additional pack years: 0.00    Total pack years: 10.00    Types: Cigarettes    Quit date: 08/2021    Years since quitting: 1.5   Smokeless tobacco: Never  Vaping Use   Vaping Use: Some days   Substances: Nicotine, Flavoring  Substance Use Topics   Alcohol use: Yes    Comment: OCC   Drug use: No     Allergies   Patient has no known allergies.   Review of Systems Review of Systems As per HPI  Physical Exam Triage Vital Signs ED Triage Vitals  Enc Vitals Group     BP 02/06/23 0812 (!) 140/94     Pulse Rate 02/06/23 0812 96     Resp 02/06/23 0812 18     Temp 02/06/23 0812 98.1 F (36.7 C)     Temp Source 02/06/23 0812 Oral     SpO2 02/06/23 0812 98 %     Weight 02/06/23 0810 196 lb (88.9 kg)     Height 02/06/23 0810 6' (1.829 m)     Head Circumference --      Peak Flow --  Pain Score 02/06/23 0810 9     Pain Loc --      Pain Edu? --      Excl. in GC? --    No data found.  Updated Vital Signs BP (!) 151/99 (BP Location: Left Arm)   Pulse 96   Temp 98.1 F (36.7 C) (Oral)   Resp 18   Ht 6' (1.829 m)   Wt 88.9 kg   SpO2 98%   BMI 26.58 kg/m   Visual Acuity Right Eye Distance:   Left Eye Distance:   Bilateral Distance:    Right Eye Near:   Left Eye Near:    Bilateral Near:     Physical Exam Vitals and nursing note reviewed.  Constitutional:      General: He is not in acute distress.    Appearance: He is not ill-appearing.  Cardiovascular:     Rate and Rhythm: Normal rate and regular rhythm.     Pulses: Normal pulses.     Heart sounds: Normal heart sounds.  Pulmonary:     Effort: Pulmonary effort is normal.     Breath sounds: Normal breath sounds.  Musculoskeletal:        General: Swelling present. No  tenderness or deformity. Normal range of motion.     Comments: Swelling of the right thumb.  Flexion of the PIP and MCP is limited secondary to swelling.  No discharge from the bite wounds.  Patient has multiple puncture wounds on the right thumb.  Skin:    General: Skin is warm.     Findings: Erythema present.  Neurological:     Mental Status: He is alert.      UC Treatments / Results  Labs (all labs ordered are listed, but only abnormal results are displayed) Labs Reviewed - No data to display  EKG   Radiology No results found.  Procedures Procedures (including critical care time)  Medications Ordered in UC Medications  Tdap (BOOSTRIX) injection 0.5 mL (0.5 mLs Intramuscular Given 02/06/23 0932)    Initial Impression / Assessment and Plan / UC Course  I have reviewed the triage vital signs and the nursing notes.  Pertinent labs & imaging results that were available during my care of the patient were reviewed by me and considered in my medical decision making (see chart for details).     1.  Dog bite of the right index finger: Daily wound dressing changes with antibiotic ointment No indication for rabies vaccination or Kedrab Augmentin twice daily for 5 days Ibuprofen as needed for pain Return precautions given.  Final Clinical Impressions(s) / UC Diagnoses   Final diagnoses:  Dog bite of index finger, initial encounter     Discharge Instructions      Daily wound dressing changes with topical antibiotic ointment Ibuprofen as needed for pain and/or fever Please complete the course of antibiotics given If you have worsening pain, swelling, wound drainage and/or fever please return to urgent care to be reevaluated.   ED Prescriptions     Medication Sig Dispense Auth. Provider   amoxicillin-clavulanate (AUGMENTIN) 875-125 MG tablet Take 1 tablet by mouth every 12 (twelve) hours. 14 tablet Kailiana Granquist, Britta Mccreedy, MD   ibuprofen (ADVIL) 600 MG tablet Take 1 tablet  (600 mg total) by mouth every 6 (six) hours as needed. 30 tablet Cloma Rahrig, Britta Mccreedy, MD      PDMP not reviewed this encounter.   Merrilee Jansky, MD 02/06/23 [phone]

## 2023-02-06 NOTE — Discharge Instructions (Addendum)
Daily wound dressing changes with topical antibiotic ointment Ibuprofen as needed for pain and/or fever Please complete the course of antibiotics given If you have worsening pain, swelling, wound drainage and/or fever please return to urgent care to be reevaluated.

## 2023-06-20 ENCOUNTER — Encounter: Payer: Self-pay | Admitting: *Deleted

## 2023-06-20 ENCOUNTER — Other Ambulatory Visit: Payer: Self-pay

## 2023-06-20 ENCOUNTER — Ambulatory Visit
Admission: EM | Admit: 2023-06-20 | Discharge: 2023-06-20 | Disposition: A | Discharge status: Home / Self Care | Payer: Managed Care, Other (non HMO) | Attending: Internal Medicine | Admitting: Internal Medicine

## 2023-06-20 DIAGNOSIS — T7840XA Allergy, unspecified, initial encounter: Secondary | ICD-10-CM

## 2023-06-20 DIAGNOSIS — R21 Rash and other nonspecific skin eruption: Secondary | ICD-10-CM | POA: Diagnosis not present

## 2023-06-20 MED ORDER — METHYLPREDNISOLONE ACETATE 80 MG/ML IJ SUSP
80.0000 mg | Freq: Once | INTRAMUSCULAR | Status: AC
  Administered 2023-06-20: 80 mg via INTRAMUSCULAR

## 2023-06-20 MED ORDER — PREDNISONE 20 MG PO TABS: 40.0000 mg | ORAL_TABLET | Freq: Every day | ORAL | 0 refills | Status: AC

## 2023-06-20 NOTE — ED Triage Notes (Signed)
Pt reports he used shampoo with hair tint in it on Friday. Saturday he had symptoms of itching, pain- similar to when he had an allergic reaction to hair dye "years ago". He used and expired epi-pen Saturday and took benadryl. Reports tightness and swelling around scalp and ears

## 2023-06-20 NOTE — ED Provider Notes (Signed)
[facility]    CSN: [id number] Arrival date & time: 06/20/23  1111      History   Chief Complaint Chief Complaint  Patient presents with   Allergic Reaction    HPI Jeremiah Moore is a 47 y.o. male.   Patient presents today with concern of allergic reaction and rash to his scalp.  Reports that approximately 2 days ago, he had his hair colored and rash subsequently started after that.  Reports he has had a previous reaction to hair dye in the past.  Denies any feelings of throat closing or shortness of breath.  Reports that he had an expired EpiPen that he used yesterday as well as Benadryl with no improvement.   Allergic Reaction   History reviewed. No pertinent past medical history.  Patient Active Problem List   Diagnosis Date Noted   Heart murmur 01/08/2022   Primary hypertension 01/08/2022   Mixed hyperlipidemia 01/08/2022   RAD (reactive airway disease) 05/07/2013   Smoker 05/07/2013    History reviewed. No pertinent surgical history.     Home Medications    Prior to Admission medications   Medication Sig Start Date End Date Taking? Authorizing Provider  amLODipine (NORVASC) 10 MG tablet Take 1 tablet (10 mg total) by mouth daily. 01/16/22 01/06/24 Yes Patwardhan, Manish J, MD  predniSONE (DELTASONE) 20 MG tablet Take 2 tablets (40 mg total) by mouth daily for 5 days. 06/20/23 06/25/23 Yes Latiana Tomei, Acie Fredrickson, FNP  triamterene-hydrochlorothiazide (MAXZIDE-25) 37.5-25 MG tablet Take 1 tablet by mouth daily.   Yes [provider]  amoxicillin-clavulanate (AUGMENTIN) 875-125 MG tablet Take 1 tablet by mouth every 12 (twelve) hours. 02/06/23   LampteyBritta Mccreedy, MD  Cholecalciferol (VITAMIN D3) 1.25 MG (50000 UT) CAPS Take 1 capsule by mouth once a week. 04/03/22   [provider]  fluticasone (FLONASE) 50 MCG/ACT nasal spray Place 2 sprays into both nostrils daily. 11/14/22   [provider]  ibuprofen (ADVIL) 600 MG tablet Take 1  tablet (600 mg total) by mouth every 6 (six) hours as needed. 02/06/23   LampteyBritta Mccreedy, MD    Family History Family History  Problem Relation Age of Onset   Hypertension Father    Clotting disorder Brother     Social History Social History   Tobacco Use   Smoking status: Former    Current packs/day: 0.00    Average packs/day: 0.5 packs/day for 20.0 years (10.0 ttl pk-yrs)    Types: Cigarettes    Start date: 08/2001    Quit date: 08/2021    Years since quitting: 1.8   Smokeless tobacco: Never  Vaping Use   Vaping status: Some Days   Substances: Nicotine, Flavoring  Substance Use Topics   Alcohol use: Yes    Comment: OCC   Drug use: No     Allergies   Patient has no known allergies.   Review of Systems Review of Systems Per HPI  Physical Exam Triage Vital Signs ED Triage Vitals  Encounter Vitals Group     BP 06/20/23 1436 130/83     Systolic BP Percentile --      Diastolic BP Percentile --      Pulse Rate 06/20/23 1436 81     Resp 06/20/23 1436 16     Temp 06/20/23 1436 97.9 F (36.6 C)     Temp Source 06/20/23 1436 Oral     SpO2 06/20/23 1436 98 %     Weight --  Height --      Head Circumference --      Peak Flow --      Pain Score 06/20/23 1434 5     Pain Loc --      Pain Education --      Exclude from Growth Chart --    No data found.  Updated Vital Signs BP 130/83 (BP Location: Left Arm)   Pulse 81   Temp 97.9 F (36.6 C) (Oral)   Resp 16   SpO2 98%   Visual Acuity Right Eye Distance:   Left Eye Distance:   Bilateral Distance:    Right Eye Near:   Left Eye Near:    Bilateral Near:     Physical Exam Constitutional:      General: He is not in acute distress.    Appearance: Normal appearance. He is not toxic-appearing or diaphoretic.  HENT:     Head: Normocephalic and atraumatic.  Eyes:     Extraocular Movements: Extraocular movements intact.     Conjunctiva/sclera: Conjunctivae normal.  Pulmonary:     Effort: Pulmonary  effort is normal.  Skin:    Comments: Patient has maculopapular rash present throughout forehead and scalp.  Very mild swelling noted.  Neurological:     General: No focal deficit present.     Mental Status: He is alert and oriented to person, place, and time. Mental status is at baseline.  Psychiatric:        Mood and Affect: Mood normal.        Behavior: Behavior normal.        Thought Content: Thought content normal.        Judgment: Judgment normal.      UC Treatments / Results  Labs (all labs ordered are listed, but only abnormal results are displayed) Labs Reviewed - No data to display  EKG   Radiology No results found.  Procedures Procedures (including critical care time)  Medications Ordered in UC Medications  methylPREDNISolone acetate (DEPO-MEDROL) injection 80 mg (80 mg Intramuscular Given 06/20/23 1519)    Initial Impression / Assessment and Plan / UC Course  I have reviewed the triage vital signs and the nursing notes.  Pertinent labs & imaging results that were available during my care of the patient were reviewed by me and considered in my medical decision making (see chart for details).     Physical exam is consistent with allergic reaction but there is no anaphylaxis present. Given antihistamines have not been helpful, I do think patient would benefit from steroid therapy.  Patient does have a history of avascular necrosis of the hip but reports that he has had a hip replacement with resolution, therefore steroids should be safe.  Benefits do outweigh risks as well. No other contraindications to steroid therapy noted in patient's history.  IM Depo-Medrol administered in urgent care and patient advised to start taking prednisone steroid burst tomorrow.  Advised strict follow-up precautions.  Patient verbalized understanding and was agreeable with plan. Final Clinical Impressions(s) / UC Diagnoses   Final diagnoses:  Allergic reaction, initial encounter   Rash and nonspecific skin eruption     Discharge Instructions      You were given a steroid shot today in urgent care.  A steroid has been prescribed for you to start taking tomorrow as well.  Follow-up if any symptoms persist or worsen.    ED Prescriptions     Medication Sig Dispense Auth. Provider   predniSONE (DELTASONE) 20 MG  tablet Take 2 tablets (40 mg total) by mouth daily for 5 days. 10 tablet Gustavus Bryant, [place]      PDMP not reviewed this encounter.   Gustavus Bryant, [place] 06/20/23 1622

## 2023-06-20 NOTE — Discharge Instructions (Signed)
You were given a steroid shot today in urgent care.  A steroid has been prescribed for you to start taking tomorrow as well.  Follow-up if any symptoms persist or worsen.

## 2023-06-20 NOTE — ED Notes (Signed)
Delay explained. Visual assessment of pt. Rash and swelling noted to hairline. NAD

## 2023-06-21 ENCOUNTER — Ambulatory Visit: Payer: Self-pay
